# Patient Record
Sex: Female | Born: 2000 | Race: White | Hispanic: No | Marital: Single | Smoking: Never smoker
Health system: Southern US, Community
[De-identification: ages and names within clinical notes are randomized; demographics above are authoritative.]

## PROBLEM LIST (undated history)

## (undated) DIAGNOSIS — K589 Irritable bowel syndrome without diarrhea: Secondary | ICD-10-CM

## (undated) DIAGNOSIS — Z9889 Other specified postprocedural states: Secondary | ICD-10-CM

## (undated) DIAGNOSIS — E0500 Thyrotoxicosis with diffuse goiter without thyrotoxic crisis or storm: Secondary | ICD-10-CM

## (undated) HISTORY — DX: Thyrotoxicosis with diffuse goiter without thyrotoxic crisis or storm: E05.00

## (undated) HISTORY — DX: Irritable bowel syndrome without diarrhea: K58.9

## (undated) HISTORY — DX: Other specified postprocedural states: Z98.890

---
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
## 2022-01-29 ENCOUNTER — Other Ambulatory Visit (INDEPENDENT_AMBULATORY_CARE_PROVIDER_SITE_OTHER): Payer: Self-pay | Admitting: "Endocrinology

## 2022-01-29 DIAGNOSIS — E0500 Thyrotoxicosis with diffuse goiter without thyrotoxic crisis or storm: Secondary | ICD-10-CM

## 2022-02-25 ENCOUNTER — Ambulatory Visit (INDEPENDENT_AMBULATORY_CARE_PROVIDER_SITE_OTHER): Payer: Medicaid Other | Admitting: Pediatric Gastroenterology

## 2022-02-25 ENCOUNTER — Encounter (INDEPENDENT_AMBULATORY_CARE_PROVIDER_SITE_OTHER): Payer: Self-pay | Admitting: Pediatric Gastroenterology

## 2022-02-25 VITALS — BP 104/62 | HR 80 | Wt 117.0 lb

## 2022-02-25 DIAGNOSIS — K580 Irritable bowel syndrome with diarrhea: Secondary | ICD-10-CM

## 2022-02-25 MED ORDER — VIBERZI 75 MG PO TABS: 75.0000 mg | ORAL_TABLET | Freq: Every day | ORAL | 1 refills | Status: DC

## 2022-02-25 MED ORDER — AMITRIPTYLINE HCL 25 MG PO TABS: 25.0000 mg | ORAL_TABLET | Freq: Every day | ORAL | 1 refills | Status: DC

## 2022-02-25 NOTE — Patient Instructions (Signed)
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
## 2022-02-26 ENCOUNTER — Other Ambulatory Visit (INDEPENDENT_AMBULATORY_CARE_PROVIDER_SITE_OTHER): Payer: Self-pay | Admitting: "Endocrinology

## 2022-02-26 DIAGNOSIS — R251 Tremor, unspecified: Secondary | ICD-10-CM

## 2022-02-26 DIAGNOSIS — R000 Tachycardia, unspecified: Secondary | ICD-10-CM

## 2022-02-26 DIAGNOSIS — R7989 Other specified abnormal findings of blood chemistry: Secondary | ICD-10-CM

## 2022-03-06 LAB — THYROID STIMULATING IMMUNOGLOBULIN: TSI: <89 % baseline (ref <140)

## 2022-03-06 LAB — T3, FREE: T3, Free: 3.1 pg/mL (ref 3.0–4.7)

## 2022-03-06 LAB — T4, FREE: Free T4: 1.3 ng/dL (ref 0.8–1.4)

## 2022-03-06 LAB — TSH: TSH: 1.95 mIU/L

## 2022-03-12 ENCOUNTER — Telehealth (INDEPENDENT_AMBULATORY_CARE_PROVIDER_SITE_OTHER): Payer: Self-pay

## 2022-03-12 NOTE — Telephone Encounter (Signed)
-----   Message from David Stall, MD sent at 03/10/2022 11:13 PM EDT ----- ?Thyroid tests were normal.  ?TSI is still less than 89. ?Please continue the current PTU dosage until we discuss dosage at her next clinic visit.  ?

## 2022-03-12 NOTE — Telephone Encounter (Signed)
Tried to call pt, vm full, unable to leave message to have pt call me back ?

## 2022-03-15 ENCOUNTER — Telehealth (INDEPENDENT_AMBULATORY_CARE_PROVIDER_SITE_OTHER): Payer: Self-pay

## 2022-03-15 ENCOUNTER — Encounter (INDEPENDENT_AMBULATORY_CARE_PROVIDER_SITE_OTHER): Payer: Self-pay

## 2022-03-15 NOTE — Telephone Encounter (Signed)
Tried to reach pt several times and voicemail was full. Sent letter to pt with results. ?

## 2022-03-15 NOTE — Telephone Encounter (Signed)
-----   Message from Michael J Brennan, MD sent at 03/10/2022 11:13 PM EDT ----- ?Thyroid tests were normal.  ?TSI is still less than 89. ?Please continue the current PTU dosage until we discuss dosage at her next clinic visit.  ?
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
## 2022-03-28 ENCOUNTER — Ambulatory Visit (INDEPENDENT_AMBULATORY_CARE_PROVIDER_SITE_OTHER): Payer: Medicaid Other | Admitting: "Endocrinology

## 2022-03-28 ENCOUNTER — Encounter (INDEPENDENT_AMBULATORY_CARE_PROVIDER_SITE_OTHER): Payer: Self-pay | Admitting: "Endocrinology

## 2022-03-28 VITALS — BP 108/72 | HR 80 | Ht 58.47 in | Wt 114.2 lb

## 2022-03-28 DIAGNOSIS — E063 Autoimmune thyroiditis: Secondary | ICD-10-CM | POA: Diagnosis not present

## 2022-03-28 DIAGNOSIS — E0500 Thyrotoxicosis with diffuse goiter without thyrotoxic crisis or storm: Secondary | ICD-10-CM | POA: Diagnosis not present

## 2022-03-28 DIAGNOSIS — R5383 Other fatigue: Secondary | ICD-10-CM | POA: Diagnosis not present

## 2022-03-28 NOTE — Patient Instructions (Signed)
Follow up visit in 3 months. Please repeat lab tests 1-2 weeks prior.  At [facility] we are committed to providing exceptional care. You will receive a patient satisfaction survey through text or email regarding your visit today. Your opinion is important to me. Comments are appreciated.  

## 2022-06-20 ENCOUNTER — Other Ambulatory Visit (INDEPENDENT_AMBULATORY_CARE_PROVIDER_SITE_OTHER): Payer: Self-pay | Admitting: "Endocrinology

## 2022-06-21 LAB — TSH: TSH: 1.46 mIU/L

## 2022-06-21 LAB — T3, FREE: T3, Free: 3.5 pg/mL (ref 3.0–4.7)

## 2022-06-21 LAB — THYROID STIMULATING IMMUNOGLOBULIN: TSI: <89 % baseline (ref <140)

## 2022-06-21 LAB — T4, FREE: Free T4: 1.2 ng/dL (ref 0.8–1.4)

## 2022-06-24 ENCOUNTER — Telehealth (INDEPENDENT_AMBULATORY_CARE_PROVIDER_SITE_OTHER): Payer: Self-pay

## 2022-06-24 NOTE — Telephone Encounter (Signed)
-----   Message from Michael J Brennan, MD sent at 06/23/2022 10:41 PM EDT ----- Thyroid tests are mid-normal. TSI is unmeasurable.  Please reduce the methimazole to one 50 mg tablet, 3 days per week.  

## 2022-06-24 NOTE — Telephone Encounter (Signed)
Called to give results. Mailbox is full. No message left.
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
## 2022-07-02 ENCOUNTER — Ambulatory Visit (INDEPENDENT_AMBULATORY_CARE_PROVIDER_SITE_OTHER): Payer: Medicaid Other | Admitting: "Endocrinology

## 2022-07-05 ENCOUNTER — Telehealth (INDEPENDENT_AMBULATORY_CARE_PROVIDER_SITE_OTHER): Payer: Self-pay

## 2022-07-05 NOTE — Telephone Encounter (Signed)
Mailbox is full.

## 2022-07-05 NOTE — Telephone Encounter (Signed)
-----   Message from Michael J Brennan, MD sent at 06/23/2022 10:41 PM EDT ----- Thyroid tests are mid-normal. TSI is unmeasurable.  Please reduce the methimazole to one 50 mg tablet, 3 days per week.  

## 2022-07-12 ENCOUNTER — Telehealth (INDEPENDENT_AMBULATORY_CARE_PROVIDER_SITE_OTHER): Payer: Self-pay

## 2022-07-12 NOTE — Telephone Encounter (Signed)
Spoke with Ms Hailey [name]. Patient takes PTU not methimazole. Told her I would get clarification.

## 2022-07-12 NOTE — Telephone Encounter (Signed)
-----   Message from David Stall, MD sent at 06/23/2022 10:41 PM EDT ----- Thyroid tests are mid-normal. TSI is unmeasurable.  Please reduce the methimazole to one 50 mg tablet, 3 days per week.
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
## 2022-07-19 ENCOUNTER — Encounter (INDEPENDENT_AMBULATORY_CARE_PROVIDER_SITE_OTHER): Payer: Self-pay | Admitting: "Endocrinology

## 2022-07-19 ENCOUNTER — Ambulatory Visit (INDEPENDENT_AMBULATORY_CARE_PROVIDER_SITE_OTHER): Payer: Medicaid Other | Admitting: "Endocrinology

## 2022-07-19 VITALS — BP 112/66 | HR 77 | Wt 115.8 lb

## 2022-07-19 DIAGNOSIS — E0500 Thyrotoxicosis with diffuse goiter without thyrotoxic crisis or storm: Secondary | ICD-10-CM

## 2022-07-19 DIAGNOSIS — E063 Autoimmune thyroiditis: Secondary | ICD-10-CM

## 2022-07-19 DIAGNOSIS — R5383 Other fatigue: Secondary | ICD-10-CM

## 2022-07-19 DIAGNOSIS — L538 Other specified erythematous conditions: Secondary | ICD-10-CM

## 2022-07-19 DIAGNOSIS — R231 Pallor: Secondary | ICD-10-CM

## 2022-07-19 DIAGNOSIS — R251 Tremor, unspecified: Secondary | ICD-10-CM

## 2022-07-19 NOTE — Patient Instructions (Signed)
Follow up in 3-4 months with Dr. Fransico Him at [facility] in [place]. Please ask Ms. Worley to put in a consult to Dr. Fransico Him.  At [facility] we are committed to providing exceptional care. You will receive a patient satisfaction survey through text or email regarding your visit today. Your opinion is important to me. Comments are appreciated.

## 2022-08-18 ENCOUNTER — Other Ambulatory Visit (INDEPENDENT_AMBULATORY_CARE_PROVIDER_SITE_OTHER): Payer: Self-pay | Admitting: "Endocrinology

## 2022-08-18 DIAGNOSIS — E0500 Thyrotoxicosis with diffuse goiter without thyrotoxic crisis or storm: Secondary | ICD-10-CM
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
## 2022-08-19 ENCOUNTER — Ambulatory Visit (INDEPENDENT_AMBULATORY_CARE_PROVIDER_SITE_OTHER): Payer: Medicaid Other | Admitting: Pediatric Gastroenterology

## 2022-08-19 ENCOUNTER — Encounter (INDEPENDENT_AMBULATORY_CARE_PROVIDER_SITE_OTHER): Payer: Self-pay | Admitting: Pediatric Gastroenterology

## 2022-08-19 VITALS — BP 114/70 | HR 72 | Wt 117.6 lb

## 2022-08-19 DIAGNOSIS — K580 Irritable bowel syndrome with diarrhea: Secondary | ICD-10-CM | POA: Diagnosis not present

## 2022-08-19 DIAGNOSIS — R1319 Other dysphagia: Secondary | ICD-10-CM

## 2022-08-19 MED ORDER — AMITRIPTYLINE HCL 25 MG PO TABS: 25.0000 mg | ORAL_TABLET | Freq: Every day | ORAL | 1 refills | Status: DC

## 2022-08-19 MED ORDER — VIBERZI 75 MG PO TABS: 75.0000 mg | ORAL_TABLET | Freq: Every day | ORAL | 1 refills | Status: AC

## 2022-08-19 NOTE — Patient Instructions (Signed)
Eosinophilic esophagitis [email or web address]   Contact information For emergencies after hours, on holidays or weekends: call [phone] and ask for the pediatric gastroenterologist on call.  For regular business hours: Pediatric GI phone number: Darlina Sicilian) [place] [phone] OR Use MyChart to send messages  A special favor Our waiting list is over 2 months. Other children are waiting to be seen in our clinic. If you cannot make your next appointment, please contact us with at least 2 days notice to cancel and reschedule. Your timely phone call will allow another child to use the clinic slot.  Thank you!

## 2022-09-09 ENCOUNTER — Telehealth (INDEPENDENT_AMBULATORY_CARE_PROVIDER_SITE_OTHER): Payer: Self-pay | Admitting: Pediatric Gastroenterology

## 2022-09-09 NOTE — Telephone Encounter (Signed)
  Name of who is calling: Karen Kitchens  Caller's Relationship to Patient: Grandmother  Best contact number: [phone]  Provider they see: Dr. Yehuda Savannah  Reason for call: Grandmother called and stated that Zeina didn't want to do any test.      PRESCRIPTION REFILL ONLY  Name of prescription:  Pharmacy:

## 2022-09-10 NOTE — Telephone Encounter (Signed)
Sent secure chat with phone message to provider.

## 2022-10-30 ENCOUNTER — Encounter: Payer: Self-pay | Admitting: "Endocrinology

## 2022-10-30 ENCOUNTER — Ambulatory Visit (INDEPENDENT_AMBULATORY_CARE_PROVIDER_SITE_OTHER): Payer: Medicaid Other | Admitting: "Endocrinology

## 2022-10-30 VITALS — BP 98/76 | HR 80 | Ht 59.0 in | Wt 116.2 lb

## 2022-10-30 DIAGNOSIS — E063 Autoimmune thyroiditis: Secondary | ICD-10-CM | POA: Diagnosis not present
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
## 2022-10-31 LAB — TSH: TSH: 1.55 u[IU]/mL (ref 0.450–4.500)

## 2022-10-31 LAB — T3, FREE: T3, Free: 4 pg/mL (ref 2.3–5.0)

## 2022-10-31 LAB — THYROGLOBULIN ANTIBODY: Thyroglobulin Antibody: 130.8 IU/mL — ABNORMAL HIGH (ref 0.0–0.9)

## 2022-10-31 LAB — T4, FREE: Free T4: 1.48 ng/dL (ref 0.93–1.60)

## 2022-10-31 LAB — THYROID PEROXIDASE ANTIBODY: Thyroperoxidase Ab SerPl-aCnc: 422 IU/mL — ABNORMAL HIGH (ref 0–26)

## 2022-11-06 ENCOUNTER — Encounter: Payer: Self-pay | Admitting: "Endocrinology

## 2022-11-06 ENCOUNTER — Ambulatory Visit (INDEPENDENT_AMBULATORY_CARE_PROVIDER_SITE_OTHER): Payer: Medicaid Other | Admitting: "Endocrinology

## 2022-11-06 VITALS — BP 96/68 | HR 92 | Ht 59.0 in | Wt 114.2 lb

## 2022-11-06 DIAGNOSIS — E0500 Thyrotoxicosis with diffuse goiter without thyrotoxic crisis or storm: Secondary | ICD-10-CM | POA: Diagnosis not present
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
## 2022-12-04 LAB — T3, FREE: T3, Free: 3.4 pg/mL (ref 2.3–5.0)

## 2022-12-04 LAB — T4, FREE: Free T4: 1.46 ng/dL (ref 0.93–1.60)

## 2022-12-04 LAB — TSH: TSH: 1.15 u[IU]/mL (ref 0.450–4.500)

## 2022-12-11 ENCOUNTER — Encounter: Payer: Self-pay | Admitting: "Endocrinology

## 2022-12-11 ENCOUNTER — Ambulatory Visit (INDEPENDENT_AMBULATORY_CARE_PROVIDER_SITE_OTHER): Payer: Medicaid Other | Admitting: "Endocrinology

## 2022-12-11 VITALS — BP 100/64 | HR 72 | Ht 59.0 in | Wt 115.0 lb

## 2022-12-11 DIAGNOSIS — E0500 Thyrotoxicosis with diffuse goiter without thyrotoxic crisis or storm: Secondary | ICD-10-CM
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
## 2023-02-20 ENCOUNTER — Other Ambulatory Visit (INDEPENDENT_AMBULATORY_CARE_PROVIDER_SITE_OTHER): Payer: Self-pay | Admitting: Pediatric Gastroenterology

## 2023-03-06 LAB — LIPID PANEL
Chol/HDL Ratio: 3.5 ratio (ref 0.0–4.4)
Cholesterol, Total: 146 mg/dL (ref 100–169)
HDL: 42 mg/dL (ref >39)
LDL Chol Calc (NIH): 80 mg/dL (ref 0–109)
Triglycerides: 135 mg/dL — ABNORMAL HIGH (ref 0–89)
VLDL Cholesterol Cal: 24 mg/dL (ref 5–40)

## 2023-03-06 LAB — T3, FREE: T3, Free: 5.4 pg/mL — ABNORMAL HIGH (ref 2.3–5.0)

## 2023-03-06 LAB — T4, FREE: Free T4: 2.41 ng/dL — ABNORMAL HIGH (ref 0.93–1.60)

## 2023-03-06 LAB — TSH: TSH: 0.027 u[IU]/mL — ABNORMAL LOW (ref 0.450–4.500)

## 2023-03-12 ENCOUNTER — Encounter: Payer: Self-pay | Admitting: "Endocrinology

## 2023-03-12 ENCOUNTER — Ambulatory Visit (INDEPENDENT_AMBULATORY_CARE_PROVIDER_SITE_OTHER): Payer: Medicaid Other | Admitting: "Endocrinology

## 2023-03-12 VITALS — BP 102/64 | HR 56 | Ht 59.0 in | Wt 111.0 lb

## 2023-03-12 DIAGNOSIS — E0500 Thyrotoxicosis with diffuse goiter without thyrotoxic crisis or storm: Secondary | ICD-10-CM | POA: Diagnosis not present
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
## 2023-03-19 ENCOUNTER — Telehealth: Payer: Self-pay | Admitting: "Endocrinology

## 2023-03-19 NOTE — Telephone Encounter (Signed)
Pts grandmother has called and is wanting to understand exactly what an uptake and scan entails.

## 2023-03-19 NOTE — Telephone Encounter (Signed)
Left a message requesting pt's grandmother to return call to the office.

## 2023-03-19 NOTE — Telephone Encounter (Signed)
Discussed with pt and her grandmother details of her upcoming thyroid uptake & scan. Understanding voiced.

## 2023-04-01 ENCOUNTER — Other Ambulatory Visit (HOSPITAL_COMMUNITY): Payer: Self-pay

## 2023-04-02 ENCOUNTER — Other Ambulatory Visit (HOSPITAL_COMMUNITY): Payer: Self-pay

## 2023-04-02 ENCOUNTER — Ambulatory Visit: Payer: Medicaid Other | Admitting: "Endocrinology

## 2023-04-08 ENCOUNTER — Encounter (HOSPITAL_COMMUNITY)
Admission: RE | Admit: 2023-04-08 | Discharge: 2023-04-08 | Disposition: A | Discharge status: Home / Self Care | Payer: Medicaid Other | Source: Ambulatory Visit | Attending: "Endocrinology | Admitting: "Endocrinology

## 2023-04-08 DIAGNOSIS — E0590 Thyrotoxicosis, unspecified without thyrotoxic crisis or storm: Secondary | ICD-10-CM | POA: Insufficient documentation

## 2023-04-08 DIAGNOSIS — E0500 Thyrotoxicosis with diffuse goiter without thyrotoxic crisis or storm: Secondary | ICD-10-CM | POA: Insufficient documentation

## 2023-04-08 MED ORDER — SODIUM IODIDE I-123 7.4 MBQ CAPS
449.0000 | ORAL_CAPSULE | Freq: Once | ORAL | Status: AC
  Administered 2023-04-08: 449 via ORAL

## 2023-04-09 ENCOUNTER — Encounter (HOSPITAL_COMMUNITY)
Admission: RE | Admit: 2023-04-09 | Discharge: 2023-04-09 | Disposition: A | Discharge status: Home / Self Care | Payer: Medicaid Other | Source: Ambulatory Visit | Attending: "Endocrinology | Admitting: "Endocrinology

## 2023-04-16 ENCOUNTER — Ambulatory Visit (INDEPENDENT_AMBULATORY_CARE_PROVIDER_SITE_OTHER): Payer: Medicaid Other | Admitting: "Endocrinology

## 2023-04-16 ENCOUNTER — Encounter: Payer: Self-pay | Admitting: "Endocrinology

## 2023-04-16 VITALS — BP 112/54 | HR 84 | Ht 59.0 in | Wt 111.2 lb

## 2023-04-16 DIAGNOSIS — E0500 Thyrotoxicosis with diffuse goiter without thyrotoxic crisis or storm: Secondary | ICD-10-CM
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
## 2023-04-21 ENCOUNTER — Ambulatory Visit (INDEPENDENT_AMBULATORY_CARE_PROVIDER_SITE_OTHER): Payer: Self-pay | Admitting: Pediatric Gastroenterology

## 2023-04-28 NOTE — Written Directive (Cosign Needed)
MOLECULAR IMAGING AND THERAPEUTICS WRITTEN DIRECTIVE   PATIENT NAME: Hailey [name]  PT DOB:   November 12, 2001                                              MRN: [id number]  ---------------------------------------------------------------------------------------------------------------------   I-131 WHOLE THYROID THERAPY (NON-CANCER)    RADIOPHARMACEUTICAL:   Iodine-131 Capsule    PRESCRIBED DOSE FOR ADMINISTRATION: 15 mCi   ROUTE OFADMINISTRATION: PO   DIAGNOSIS:  Hyperthyroidism   REFERRING PHYSICIAN: Dr. Fransico Him   TSH:    Lab Results  Component Value Date   TSH 0.027 (L) 03/05/2023   TSH 1.150 12/03/2022   TSH 1.550 10/30/2022     PRIOR I-131 THERAPY (Date and Dose): N/A   PRIOR RADIOLOGY EXAMS (Results and Date): NM THYROID MULT UPTAKE W/IMAGING  Result Date: 04/09/2023 CLINICAL DATA:  Hyperthyroidism EXAM: THYROID SCAN AND UPTAKE - 4 AND 24 HOURS TECHNIQUE: Following oral administration of I-123 capsule, anterior planar imaging was acquired at 24 hours. Thyroid uptake was calculated with a thyroid probe at 4-6 hours and 24 hours. RADIOPHARMACEUTICALS:  449 uCi I-123 sodium iodide p.o. COMPARISON:  None Available. FINDINGS: Planar imaging the thyroid demonstrates homogeneous radiotracer uptake. No focal abnormalities. 4 hour I-123 uptake = 29.2% (normal 5-20%) 24 hour I-123 uptake = 62.1% (normal 10-30%) IMPRESSION: 1. Increased iodine uptake values as above. 2. No focal imaging findings. Electronically Signed   By: Sharlet Salina M.D.   On: 04/09/2023 20:58      ADDITIONAL PHYSICIAN COMMENTS/NOTES Trial of methimazole.  Graves since age 67.  On beta blocker.  Keep on Beta blocker during treatment (propranolol).    AUTHORIZED USER SIGNATURE & TIME STAMP: Patriciaann Clan, MD   04/30/23    8:33 AM

## 2023-05-08 ENCOUNTER — Other Ambulatory Visit (HOSPITAL_COMMUNITY)
Admission: RE | Admit: 2023-05-08 | Discharge: 2023-05-08 | Disposition: A | Discharge status: Home / Self Care | Payer: Medicaid Other | Source: Ambulatory Visit | Attending: "Endocrinology | Admitting: "Endocrinology

## 2023-05-08 ENCOUNTER — Encounter (HOSPITAL_COMMUNITY)
Admission: RE | Admit: 2023-05-08 | Discharge: 2023-05-08 | Disposition: A | Discharge status: Home / Self Care | Payer: Medicaid Other | Source: Ambulatory Visit | Attending: "Endocrinology | Admitting: "Endocrinology

## 2023-05-08 DIAGNOSIS — Z0000 Encounter for general adult medical examination without abnormal findings: Secondary | ICD-10-CM | POA: Diagnosis not present

## 2023-05-08 DIAGNOSIS — E0500 Thyrotoxicosis with diffuse goiter without thyrotoxic crisis or storm: Secondary | ICD-10-CM | POA: Diagnosis not present

## 2023-05-08 LAB — PREGNANCY, URINE: Preg Test, Ur: NEGATIVE

## 2023-05-08 MED ORDER — SODIUM IODIDE I 131 CAPSULE
15.5000 | Freq: Once | INTRAVENOUS | Status: AC | PRN
  Administered 2023-05-08: 15.5 via ORAL

## 2023-06-12 LAB — T4, FREE: Free T4: 1.7 ng/dL (ref 0.82–1.77)

## 2023-06-12 LAB — T3, FREE: T3, Free: 3.5 pg/mL (ref 2.0–4.4)

## 2023-06-12 LAB — TSH: TSH: <0.005 u[IU]/mL — ABNORMAL LOW (ref 0.450–4.500)

## 2023-06-19 ENCOUNTER — Ambulatory Visit (INDEPENDENT_AMBULATORY_CARE_PROVIDER_SITE_OTHER): Payer: Medicaid Other | Admitting: "Endocrinology

## 2023-06-19 ENCOUNTER — Encounter: Payer: Self-pay | Admitting: "Endocrinology

## 2023-06-19 VITALS — BP 106/54 | HR 64 | Ht 59.0 in | Wt 110.8 lb

## 2023-06-19 DIAGNOSIS — E0500 Thyrotoxicosis with diffuse goiter without thyrotoxic crisis or storm: Secondary | ICD-10-CM | POA: Diagnosis not present
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
## 2023-07-24 ENCOUNTER — Ambulatory Visit: Payer: Medicaid Other | Admitting: "Endocrinology

## 2025-01-24 ENCOUNTER — Ambulatory Visit: Admitting: Allergy & Immunology
